# Patient Record
Sex: Female | Born: 1990 | Race: Black or African American | Hispanic: No | Marital: Single | Smoking: Former smoker
Health system: Southern US, Community
[De-identification: ages and names within clinical notes are randomized; demographics above are authoritative.]

## PROBLEM LIST (undated history)

## (undated) DIAGNOSIS — E079 Disorder of thyroid, unspecified: Secondary | ICD-10-CM

## (undated) HISTORY — PX: MOLE REMOVAL: SHX2046

---

## 2013-12-12 ENCOUNTER — Encounter (HOSPITAL_COMMUNITY): Payer: Self-pay | Admitting: Emergency Medicine

## 2013-12-12 ENCOUNTER — Emergency Department (HOSPITAL_COMMUNITY)
Admission: EM | Admit: 2013-12-12 | Discharge: 2013-12-12 | Disposition: A | Discharge status: Home / Self Care | Payer: Medicaid Other | Attending: Emergency Medicine | Admitting: Emergency Medicine

## 2013-12-12 DIAGNOSIS — Z87891 Personal history of nicotine dependence: Secondary | ICD-10-CM | POA: Diagnosis not present

## 2013-12-12 DIAGNOSIS — S01311A Laceration without foreign body of right ear, initial encounter: Secondary | ICD-10-CM | POA: Diagnosis present

## 2013-12-12 DIAGNOSIS — M791 Myalgia: Secondary | ICD-10-CM | POA: Diagnosis not present

## 2013-12-12 DIAGNOSIS — Z8639 Personal history of other endocrine, nutritional and metabolic disease: Secondary | ICD-10-CM | POA: Insufficient documentation

## 2013-12-12 DIAGNOSIS — S00431A Contusion of right ear, initial encounter: Secondary | ICD-10-CM

## 2013-12-12 HISTORY — DX: Disorder of thyroid, unspecified: E07.9

## 2013-12-12 MED ORDER — LIDOCAINE-EPINEPHRINE-TETRACAINE (LET) SOLUTION
3.0000 mL | Freq: Once | NASAL | Status: AC
  Administered 2013-12-12: 3 mL via TOPICAL
  Filled 2013-12-12: qty 3

## 2013-12-12 MED ORDER — NAPROXEN 500 MG PO TABS: 500.0000 mg | ORAL_TABLET | Freq: Two times a day (BID) | ORAL | Status: DC

## 2013-12-12 MED ORDER — IBUPROFEN 800 MG PO TABS
800.0000 mg | ORAL_TABLET | Freq: Once | ORAL | Status: AC
  Administered 2013-12-12: 800 mg via ORAL
  Filled 2013-12-12: qty 1

## 2013-12-12 NOTE — Discharge Instructions (Signed)
Please call your doctor for a followup appointment within 24-48 hours. When you talk to your doctor please let them know that you were seen in the emergency department and have them acquire all of your records so that they can discuss the findings with you and formulate a treatment plan to fully care for your new and ongoing problems. ° °[facility] Doctor List ° ° ° °Edward Hawkins MD. Specialty: Pulmonary Disease Contact information: [address]  °[address]  °[place] [address]  °[phone]  ° °Margaret Simpson, MD. Specialty: Family Medicine Contact information: [address]  °[place] [address]  °[phone]  ° °Scott Luking, MD. Specialty: Family Medicine Contact information: [address]  °[address]  °[place] [address]  °[phone]  ° °Tesfaye Fanta, MD Specialty: Internal Medicine Contact information: [address]  °[place] [address]  °[phone]  ° °Zach Hall, MD. Specialty: Internal Medicine Contact information: [address]  °[place] [address]  °[phone]  ° °Angus Mcinnis, MD. Specialty: Family Medicine Contact information: [address]  °[place] [address]  °[phone]  ° °Stephen Knowlton, MD. Specialty: Family Medicine Contact information: [address]  °[address]  °[place] [address]  °[phone]  ° °Roy Fagan, MD. Specialty: Internal Medicine Contact information: [address]  °[address]  ° [place] [address]  °[phone]  ° ° °
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
## 2016-02-13 ENCOUNTER — Ambulatory Visit (INDEPENDENT_AMBULATORY_CARE_PROVIDER_SITE_OTHER): Payer: Medicaid Other | Admitting: Certified Nurse Midwife

## 2016-02-13 VITALS — BP 104/63 | HR 86 | Wt 101.1 lb

## 2016-02-13 DIAGNOSIS — Z113 Encounter for screening for infections with a predominantly sexual mode of transmission: Secondary | ICD-10-CM

## 2016-02-13 DIAGNOSIS — Z3481 Encounter for supervision of other normal pregnancy, first trimester: Secondary | ICD-10-CM

## 2016-02-13 DIAGNOSIS — Z3687 Encounter for antenatal screening for uncertain dates: Secondary | ICD-10-CM

## 2016-02-13 DIAGNOSIS — O26851 Spotting complicating pregnancy, first trimester: Secondary | ICD-10-CM

## 2016-02-13 NOTE — Progress Notes (Signed)
Waynette ButteryErica Angert presents for NOB nurse interview visit. Pregnancy confirmation done at [facility]. lmp 12/22/15 (uncertain) EDD 09/27/16. 7 4/7.   G3- .P2002- . Pregnancy education material explained and given. __0_ cats in the home. NOB labs ordered. Drug screen and HIV ordered. Labs to be drawn at time of u/s. (Lab Amy off today) PNV encouraged. Genetic screening  to discuss with provider.  Mild nausea note. No meds needed at this time. Spotting x 1 day about a week ago.  None since. Will order dating scan.  Pt. To follow up with provider in _3_ weeks for NOB physical.  All questions answered.

## 2016-02-14 ENCOUNTER — Ambulatory Visit (INDEPENDENT_AMBULATORY_CARE_PROVIDER_SITE_OTHER): Payer: Medicaid Other

## 2016-02-14 ENCOUNTER — Other Ambulatory Visit: Payer: Self-pay | Admitting: Certified Nurse Midwife

## 2016-02-14 ENCOUNTER — Other Ambulatory Visit: Payer: Medicaid Other

## 2016-02-14 DIAGNOSIS — Z3687 Encounter for antenatal screening for uncertain dates: Secondary | ICD-10-CM

## 2016-02-14 DIAGNOSIS — O26851 Spotting complicating pregnancy, first trimester: Secondary | ICD-10-CM

## 2016-02-15 ENCOUNTER — Other Ambulatory Visit: Payer: Self-pay | Admitting: Certified Nurse Midwife

## 2016-02-15 LAB — CBC WITH DIFFERENTIAL
BASOS: 0 %
Basophils Absolute: 0 10*3/uL (ref 0.0–0.2)
EOS (ABSOLUTE): 0 10*3/uL (ref 0.0–0.4)
Eos: 0 %
Hematocrit: 36.4 % (ref 34.0–46.6)
Hemoglobin: 12.8 g/dL (ref 11.1–15.9)
Immature Grans (Abs): 0 10*3/uL (ref 0.0–0.1)
Immature Granulocytes: 0 %
LYMPHS ABS: 2.4 10*3/uL (ref 0.7–3.1)
Lymphs: 25 %
MCH: 31.1 pg (ref 26.6–33.0)
MCHC: 35.2 g/dL (ref 31.5–35.7)
MCV: 89 fL (ref 79–97)
MONOS ABS: 0.5 10*3/uL (ref 0.1–0.9)
Monocytes: 5 %
NEUTROS ABS: 6.5 10*3/uL (ref 1.4–7.0)
NEUTROS PCT: 70 %
RBC: 4.11 x10E6/uL (ref 3.77–5.28)
RDW: 14.1 % (ref 12.3–15.4)
WBC: 9.4 10*3/uL (ref 3.4–10.8)

## 2016-02-15 LAB — ABO AND RH: RH TYPE: POSITIVE

## 2016-02-15 LAB — VARICELLA ZOSTER ANTIBODY, IGG

## 2016-02-15 LAB — NICOTINE SCREEN, URINE

## 2016-02-15 LAB — SICKLE CELL SCREEN: SICKLE CELL SCREEN: POSITIVE — AB

## 2016-02-15 LAB — RPR: RPR: NONREACTIVE

## 2016-02-15 LAB — HEPATITIS B SURFACE ANTIGEN: Hepatitis B Surface Ag: NEGATIVE

## 2016-02-15 LAB — ANTIBODY SCREEN: Antibody Screen: NEGATIVE

## 2016-02-15 LAB — HIV ANTIBODY (ROUTINE TESTING W REFLEX): HIV Screen 4th Generation wRfx: NONREACTIVE

## 2016-02-15 LAB — RUBELLA SCREEN: RUBELLA: 23.7 {index} (ref >0.99)

## 2016-02-16 LAB — URINE CULTURE: Organism ID, Bacteria: NO GROWTH

## 2016-02-16 LAB — GC/CHLAMYDIA PROBE AMP
Chlamydia trachomatis, NAA: POSITIVE — AB
Neisseria gonorrhoeae by PCR: POSITIVE — AB

## 2016-02-19 ENCOUNTER — Telehealth: Payer: Self-pay | Admitting: Certified Nurse Midwife

## 2016-02-19 DIAGNOSIS — A749 Chlamydial infection, unspecified: Secondary | ICD-10-CM | POA: Insufficient documentation

## 2016-02-19 DIAGNOSIS — O98819 Other maternal infectious and parasitic diseases complicating pregnancy, unspecified trimester: Secondary | ICD-10-CM

## 2016-02-19 DIAGNOSIS — O98811 Other maternal infectious and parasitic diseases complicating pregnancy, first trimester: Principal | ICD-10-CM

## 2016-02-19 DIAGNOSIS — O98211 Gonorrhea complicating pregnancy, first trimester: Secondary | ICD-10-CM | POA: Insufficient documentation

## 2016-02-19 MED ORDER — AZITHROMYCIN 250 MG PO TABS: 1000.0000 mg | ORAL_TABLET | Freq: Once | ORAL | 1 refills | Status: AC

## 2016-02-19 NOTE — Telephone Encounter (Signed)
Telephone call to patient. Name and date of birth verified. Pt speaking and okay to proceed with phone call.   Informed of positive chlamydia and gonorrhea results from New OB labs. Discussed importance of taking medication, informing partners, and completing test of cure. Pt verbalized understanding.   Preferred pharmacy verfied and Rx for Azithromycin called in.   Pt will come to office for Rocephin injection.   Call back with further needs, questions, or concerns.    Gunnar BullaJenkins Michelle Lawhorn, CNM

## 2016-02-22 ENCOUNTER — Telehealth: Payer: Self-pay

## 2016-02-22 NOTE — Telephone Encounter (Signed)
Left message for pt to return call to followup on last visit.

## 2016-02-22 NOTE — Telephone Encounter (Signed)
-----   Message from Gunnar BullaJenkins Michelle Lawhorn, CNM sent at 02/19/2016  6:44 PM EST ----- Called last night and informed patient of positive STI results. She will be stopping by the office tomorrow for Ceftriaxone injection. Please remember to complete [place] Si[place] Sheet for the health department.   Thanks, JML

## 2016-02-25 NOTE — Telephone Encounter (Signed)
Attempted to contact patient- home number just beeped not able to leave a message. Mobile number was answered by a female who told this writer I had the wrong number. Per Sydney Stout at [facility] patient did pick up script for azithromycin. Letter sent to address listed for patient to please contact the office for a rhocephen inj.

## 2016-02-26 LAB — URINALYSIS, ROUTINE W REFLEX MICROSCOPIC
Bilirubin, UA: NEGATIVE
GLUCOSE, UA: NEGATIVE
Ketones, UA: NEGATIVE
LEUKOCYTES UA: NEGATIVE
Nitrite, UA: NEGATIVE
PH UA: 5.5 (ref 5.0–7.5)
PROTEIN UA: NEGATIVE
RBC, UA: NEGATIVE
Specific Gravity, UA: 1.014 (ref 1.005–1.030)
UUROB: 0.2 mg/dL (ref 0.2–1.0)

## 2016-02-26 LAB — MONITOR DRUG PROFILE 14(MW)

## 2016-03-06 ENCOUNTER — Encounter: Payer: Medicaid Other | Admitting: Certified Nurse Midwife

## 2016-03-10 ENCOUNTER — Encounter: Payer: Medicaid Other | Admitting: Certified Nurse Midwife

## 2016-03-11 ENCOUNTER — Encounter: Payer: Medicaid Other | Admitting: Certified Nurse Midwife

## 2016-03-13 ENCOUNTER — Encounter: Payer: Medicaid Other | Admitting: Certified Nurse Midwife

## 2016-03-21 ENCOUNTER — Ambulatory Visit (INDEPENDENT_AMBULATORY_CARE_PROVIDER_SITE_OTHER): Payer: Medicaid Other | Admitting: Certified Nurse Midwife

## 2016-03-21 ENCOUNTER — Encounter: Payer: Self-pay | Admitting: Certified Nurse Midwife

## 2016-03-21 VITALS — BP 107/68 | HR 85 | Wt 107.0 lb

## 2016-03-21 DIAGNOSIS — Z23 Encounter for immunization: Secondary | ICD-10-CM | POA: Diagnosis not present

## 2016-03-21 DIAGNOSIS — Z3482 Encounter for supervision of other normal pregnancy, second trimester: Secondary | ICD-10-CM | POA: Diagnosis not present

## 2016-03-21 LAB — POCT URINALYSIS DIPSTICK
BILIRUBIN UA: NEGATIVE
Blood, UA: NEGATIVE
GLUCOSE UA: NEGATIVE
Ketones, UA: NEGATIVE
Nitrite, UA: NEGATIVE
PROTEIN UA: NEGATIVE
SPEC GRAV UA: 1.015
Urobilinogen, UA: NEGATIVE
pH, UA: 6

## 2016-03-21 MED ORDER — CEFTRIAXONE SODIUM 250 MG IJ SOLR
250.0000 mg | Freq: Once | INTRAMUSCULAR | Status: AC
  Administered 2016-03-21: 250 mg via INTRAMUSCULAR

## 2016-03-21 NOTE — Progress Notes (Signed)
NOB PE- Pos for GC/CH- Completed Zithromax. Rochephin given today.

## 2016-03-21 NOTE — Progress Notes (Signed)
NEW OB HISTORY AND PHYSICAL  SUBJECTIVE:       Sydney Stout is a 26 y.o. G52P2002 female, Patient's last menstrual period was 12/22/2015 (approximate)., Estimated Date of Delivery: 09/11/16, [redacted]w[redacted]d, presents today for establishment of Prenatal Care.  She has no unusual complaints. She took her azithromycin, but forgot to come by the office to get her rocephin.   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 Her new partner who is the FOB has not been tested.    Gynecologic History  Patient's last menstrual period was 12/22/2015 (approximate).   Last Pap: 2015. Results were: normal  Obstetric History OB History  Gravida Para Term Preterm AB Living  3 2 2     2   SAB TAB Ectopic Multiple Live Births          2    # Outcome Date GA Lbr Len/2nd Weight Sex Delivery Anes PTL Lv  3 Current           2 Term 01/13/09   5 lb 11 oz (2.58 kg) F Vag-Spont  N LIV  1 Term 02/13/08   5 lb 11 oz (2.58 kg) M Vag-Spont  N LIV      Past Medical History:  Diagnosis Date  . Thyroid disease    per patient. Unsure of type or problem    Past Surgical History:  Procedure Laterality Date  . MOLE REMOVAL     behind right ear    Current Outpatient Prescriptions on File Prior to Visit  Medication Sig Dispense Refill  . Prenatal Vit-Fe Fumarate-FA (PRENATAL MULTIVITAMIN) TABS tablet Take 1 tablet by mouth daily at 12 noon.     No current facility-administered medications on file prior to visit.     No Known Allergies  Social History   Social History  . Marital status: Single    Spouse name: N/A  . Number of children: N/A  . Years of education: N/A   Occupational History  . Not on file.   Social History Main Topics  . Smoking status: Former Smoker    Quit date: 2015  . Smokeless tobacco: Former Neurosurgeon  . Alcohol use No  . Drug use: No  . Sexual activity: Yes    Birth control/ protection: None   Other Topics Concern  . Not on file   Social History Narrative  . No  narrative on file    Family History  Problem Relation Age of Onset  . Cancer Neg Hx   . Diabetes Neg Hx   . Hypertension Neg Hx     The following portions of the patient's history were reviewed and updated as appropriate: allergies, current medications, past OB history, past medical history, past surgical history, past family history, past social history, and problem list.    OBJECTIVE: Initial Physical Exam (New OB)  GENERAL APPEARANCE: alert, well appearing, in no apparent distress  HEAD: normocephalic, atraumatic  MOUTH: mucous membranes moist, pharynx normal without lesions  THYROID: no thyromegaly or masses present  BREASTS: no masses noted, no significant tenderness, no palpable axillary nodes, no skin changes  LUNGS: clear to auscultation, no wheezes, rales or rhonchi, symmetric air entry  HEART: regular rate and rhythm, no murmurs  ABDOMEN: soft, nontender, nondistended, no abnormal masses, no epigastric pain, fundus soft, nontender 15 weeks size and FHT present  EXTREMITIES: no redness or tenderness in the calves or thighs  SKIN: normal coloration and turgor, no rashes  LYMPH NODES: no adenopathy palpable  NEUROLOGIC:  alert, oriented, normal speech, no focal findings or movement disorder noted  PELVIC EXAM EXTERNAL GENITALIA: normal appearing vulva with no masses, tenderness or lesions VAGINA: no abnormal discharge or lesions CERVIX: no lesions or cervical motion tenderness UTERUS: gravid and consistent with 15 weeks ADNEXA: no masses palpable and nontender  ASSESSMENT: Normal pregnancy Positive sickle cell screen Chlamydia and Gonorrhea affecting pregnancy  PLAN: Prenatal care Rocephin IM today RTC x 4 weeks for ROB and anatomy scan See orders   Gunnar BullaJenkins Michelle Alexzandrea Normington, CNM

## 2016-03-21 NOTE — Patient Instructions (Signed)
Second Trimester of Pregnancy The second trimester is from week 13 through week 28, month 4 through 6. This is often the time in pregnancy that you feel your best. Often times, morning sickness has lessened or quit. You may have more energy, and you may get hungry more often. Your unborn baby (fetus) is growing rapidly. At the end of the sixth month, he or she is about 9 inches long and weighs about 1 pounds. You will likely feel the baby move (quickening) between 18 and 20 weeks of pregnancy. Follow these instructions at home:  Avoid all smoking, herbs, and alcohol. Avoid drugs not approved by your doctor.  Do not use any tobacco products, including cigarettes, chewing tobacco, and electronic cigarettes. If you need help quitting, ask your doctor. You may get counseling or other support to help you quit.  Only take medicine as told by your doctor. Some medicines are safe and some are not during pregnancy.  Exercise only as told by your doctor. Stop exercising if you start having cramps.  Eat regular, healthy meals.  Wear a good support bra if your breasts are tender.  Do not use hot tubs, steam rooms, or saunas.  Wear your seat belt when driving.  Avoid raw meat, uncooked cheese, and liter boxes and soil used by cats.  Take your prenatal vitamins.  Take 1500-2000 milligrams of calcium daily starting at the 20th week of pregnancy until you deliver your baby.  Try taking medicine that helps you poop (stool softener) as needed, and if your doctor approves. Eat more fiber by eating fresh fruit, vegetables, and whole grains. Drink enough fluids to keep your pee (urine) clear or pale yellow.  Take warm water baths (sitz baths) to soothe pain or discomfort caused by hemorrhoids. Use hemorrhoid cream if your doctor approves.  If you have puffy, bulging veins (varicose veins), wear support hose. Raise (elevate) your feet for 15 minutes, 3-4 times a day. Limit salt in your diet.  Avoid heavy  lifting, wear low heals, and sit up straight.  Rest with your legs raised if you have leg cramps or low back pain.  Visit your dentist if you have not gone during your pregnancy. Use a soft toothbrush to brush your teeth. Be gentle when you floss.  You can have sex (intercourse) unless your doctor tells you not to.  Go to your doctor visits. Get help if:  You feel dizzy.  You have mild cramps or pressure in your lower belly (abdomen).  You have a nagging pain in your belly area.  You continue to feel sick to your stomach (nauseous), throw up (vomit), or have watery poop (diarrhea).  You have bad smelling fluid coming from your vagina.  You have pain with peeing (urination). Get help right away if:  You have a fever.  You are leaking fluid from your vagina.  You have spotting or bleeding from your vagina.  You have severe belly cramping or pain.  You lose or gain weight rapidly.  You have trouble catching your breath and have chest pain.  You notice sudden or extreme puffiness (swelling) of your face, hands, ankles, feet, or legs.  You have not felt the baby move in over an hour.  You have severe headaches that do not go away with medicine.  You have vision changes. This information is not intended to replace advice given to you by your health care provider. Make sure you discuss any questions you have with your health care   provider. Document Released: 04/30/2009 Document Revised: 07/12/2015 Document Reviewed: 04/06/2012 Elsevier Interactive Patient Education  2017 Elsevier Inc.  

## 2016-03-29 ENCOUNTER — Encounter: Payer: Self-pay | Admitting: Certified Nurse Midwife

## 2016-04-18 ENCOUNTER — Ambulatory Visit (INDEPENDENT_AMBULATORY_CARE_PROVIDER_SITE_OTHER): Payer: Medicaid Other | Admitting: Certified Nurse Midwife

## 2016-04-18 ENCOUNTER — Ambulatory Visit: Payer: Medicaid Other

## 2016-04-18 ENCOUNTER — Encounter: Payer: Self-pay | Admitting: Certified Nurse Midwife

## 2016-04-18 VITALS — BP 118/62 | HR 107 | Wt 120.4 lb

## 2016-04-18 DIAGNOSIS — Z3492 Encounter for supervision of normal pregnancy, unspecified, second trimester: Secondary | ICD-10-CM

## 2016-04-18 LAB — POCT URINALYSIS DIPSTICK
BILIRUBIN UA: NEGATIVE
Blood, UA: NEGATIVE
Ketones, UA: NEGATIVE
LEUKOCYTES UA: NEGATIVE
Nitrite, UA: NEGATIVE
Protein, UA: NEGATIVE
Spec Grav, UA: 1.02
Urobilinogen, UA: NEGATIVE
pH, UA: 5

## 2016-04-18 NOTE — Progress Notes (Signed)
ROB-Missed anatomy scan scheduled for today due to transportation issues. Pt unsure about continuing pregnancy, FOB questions paternity. Information for [facility] given. Pt will reschedule anatomy scan and ROB x 4 weeks if needed. GC/Ch TOC sent.

## 2016-04-21 ENCOUNTER — Ambulatory Visit (INDEPENDENT_AMBULATORY_CARE_PROVIDER_SITE_OTHER): Payer: Medicaid Other

## 2016-04-21 DIAGNOSIS — Z3482 Encounter for supervision of other normal pregnancy, second trimester: Secondary | ICD-10-CM | POA: Diagnosis not present

## 2016-04-21 NOTE — Addendum Note (Signed)
Addended by: Brooke DareSICK, Tanish Prien L on: 04/21/2016 01:06 PM   Modules accepted: Orders

## 2016-04-22 LAB — GC/CHLAMYDIA PROBE AMP
Chlamydia trachomatis, NAA: NEGATIVE
NEISSERIA GONORRHOEAE BY PCR: NEGATIVE

## 2016-05-15 ENCOUNTER — Ambulatory Visit (INDEPENDENT_AMBULATORY_CARE_PROVIDER_SITE_OTHER): Payer: Medicaid Other | Admitting: Certified Nurse Midwife

## 2016-05-15 ENCOUNTER — Encounter: Payer: Self-pay | Admitting: Certified Nurse Midwife

## 2016-05-15 VITALS — BP 115/63 | HR 89 | Wt 126.7 lb

## 2016-05-15 DIAGNOSIS — O4442 Low lying placenta NOS or without hemorrhage, second trimester: Secondary | ICD-10-CM

## 2016-05-15 DIAGNOSIS — Z130 Encounter for screening for diseases of the blood and blood-forming organs and certain disorders involving the immune mechanism: Secondary | ICD-10-CM

## 2016-05-15 DIAGNOSIS — O99012 Anemia complicating pregnancy, second trimester: Secondary | ICD-10-CM

## 2016-05-15 DIAGNOSIS — Z131 Encounter for screening for diabetes mellitus: Secondary | ICD-10-CM

## 2016-05-15 DIAGNOSIS — Z3482 Encounter for supervision of other normal pregnancy, second trimester: Secondary | ICD-10-CM

## 2016-05-15 LAB — POCT URINALYSIS DIPSTICK
Bilirubin, UA: NEGATIVE
Blood, UA: NEGATIVE
Glucose, UA: NEGATIVE
KETONES UA: NEGATIVE
Leukocytes, UA: NEGATIVE
Nitrite, UA: NEGATIVE
Protein, UA: NEGATIVE
Spec Grav, UA: 1.015 (ref 1.030–1.035)
Urobilinogen, UA: NEGATIVE (ref ?–2.0)
pH, UA: 6 (ref 5.0–8.0)

## 2016-05-15 MED ORDER — FUSION PLUS PO CAPS: 1.0000 | ORAL_CAPSULE | Freq: Every day | ORAL | 1 refills | Status: AC

## 2016-05-15 MED ORDER — CITRANATAL BLOOM DHA 90-1 & 300 MG PO MISC: 1.0000 | Freq: Every day | ORAL | 6 refills | Status: AC

## 2016-05-15 NOTE — Patient Instructions (Addendum)
Round Ligament Pain The round ligament is a cord of muscle and tissue that helps to support the uterus. It can become a source of pain during pregnancy if it becomes stretched or twisted as the baby grows. The pain usually begins in the second trimester of pregnancy, and it can come and go until the baby is delivered. It is not a serious problem, and it does not cause harm to the baby. Round ligament pain is usually a short, sharp, and pinching pain, but it can also be a dull, lingering, and aching pain. The pain is felt in the lower side of the abdomen or in the groin. It usually starts deep in the groin and moves up to the outside of the hip area. Pain can occur with:  A sudden change in position.  Rolling over in bed.  Coughing or sneezing.  Physical activity. Follow these instructions at home: Watch your condition for any changes. Take these steps to help with your pain:  When the pain starts, relax. Then try:  Sitting down.  Flexing your knees up to your abdomen.  Lying on your side with one pillow under your abdomen and another pillow between your legs.  Sitting in a warm bath for 15-20 minutes or until the pain goes away.  Take over-the-counter and prescription medicines only as told by your health care provider.  Move slowly when you sit and stand.  Avoid long walks if they cause pain.  Stop or lessen your physical activities if they cause pain. Contact a health care provider if:  Your pain does not go away with treatment.  You feel pain in your back that you did not have before.  Your medicine is not helping. Get help right away if:  You develop a fever or chills.  You develop uterine contractions.  You develop vaginal bleeding.  You develop nausea or vomiting.  You develop diarrhea.  You have pain when you urinate. This information is not intended to replace advice given to you by your health care provider. Make sure you discuss any questions you have with  your health care provider. Document Released: 11/13/2007 Document Revised: 07/12/2015 Document Reviewed: 04/12/2014 Elsevier Interactive Patient Education  2017 Elsevier Inc. Minor Illnesses and Medications in Pregnancy  Cold/Flu:  Sudafed for congestion- Robitussin (plain) for cough- Tylenol for discomfort.  Please follow the directions on the label.  Try not to take any more than needed.  OTC Saline nasal spray and air humidifier or cool-mist  Vaporizer to sooth nasal irritation and to loosen congestion.  It is also important to increase intake of non carbonated fluids, especially if you have a fever.  Constipation:  Colace-2 capsules at bedtime; Metamucil- follow directions on label; Senokot- 1 tablet at bedtime.  Any one of these medications can be used.  It is also very important to increase fluids and fruits along with regular exercise.  If problem persists please call the office.  Diarrhea:  Kaopectate as directed on the label.  Eat a bland diet and increase fluids.  Avoid highly seasoned foods.  Headache:  Tylenol 1 or 2 tablets every 3-4 hours as needed  Indigestion:  Maalox, Mylanta, Tums or Rolaids- as directed on label.  Also try to eat small meals and avoid fatty, greasy or spicy foods.  Nausea with or without Vomiting:  Nausea in pregnancy is caused by increased levels of hormones in the body which influence the digestive system and cause irritation when stomach acids accumulate.  Symptoms usually  subside after 1st trimester of pregnancy.  Try the following: 1. Keep saltines, graham crackers or dry toast by your bed to eat upon awakening. 2. Don't let your stomach get empty.  Try to eat 5-6 small meals per day instead of 3 large ones. 3. Avoid greasy fatty or highly seasoned foods.  4. Take OTC Unisom 1 tablet at bed time along with OTC Vitamin B6 25-50 mg 3 times per day.    If nausea continues with vomiting and you are unable to keep down food and fluids you may need a  prescription medication.  Please notify your provider.   Sore throat:  Chloraseptic spray, throat lozenges and or plain Tylenol.  Vaginal Yeast Infection:  OTC Monistat for 7 days as directed on label.  If symptoms do not resolve within a week notify provider.  If any of the above problems do not subside with recommended treatment please call the office for further assistance.   Do not take Aspirin, Advil, Motrin or Ibuprofen.  * * OTC= Over the counter  

## 2016-05-15 NOTE — Progress Notes (Signed)
ROB-Pt doing well, she and the FOB are "working through things" and have decided to continue with pregnancy. Reports nightly heartburn, discussed use of OTC and home treatment measures. WIC appointment 04/2016, fingerstick iron "was low". Rx Citranatal Bloom. Reviewed anatomy scan and findings. Anatomy wnl, low lying placenta. Reviewed red flag symptoms and when to call. RTC x 4-5 weeks for glucola, f/u US, and ROB.   ULTRASOUND REPORT  Location: [facility] Date of Service: 04/21/16  Indications: Anatomy Findings:  Singleton intrauterine pregnancy is visualized with FHR at 152 BPM. Biometrics give an (U/S) Gestational age of [redacted] weeks and 4 days, and an (U/S) EDD of 09/11/16; this correlates with the clinically established EDD of 09/11/16.  Fetal presentation is footling breech, spine variable EFW: 297 grams ( 0 lbs. 10 oz.) Placenta: Posterior, grade 0 and low lying at 1.3 cm from the cervical os. AFI: Subjectively adequate with an MVP of 5.2 cm.  Anatomic survey is complete and appears WNL. Gender - Female.   Right Ovary measures 3.0 x 1.8 x 2.0 cm, and appears WNL. Left Ovary measures 2.3 x 1.3 x 2.0 cm, and appears WNL. There is no evidence of a corpus luteal cyst. Survey of the adnexa demonstrates no adnexal masses. There is no free peritoneal fluid in the cul de sac.  Impression: 1. 19 week 4 day Viable Singleton Intrauterine pregnancy by U/S. 2. (U/S) EDD is consistent with Clinically established (LMP) EDD of 09/11/16. 3. Normal appearing anatomy scan. 4. Low lying posterior placenta, 1.3 cm to the cervical os.

## 2016-05-27 ENCOUNTER — Telehealth: Payer: Self-pay | Admitting: Certified Nurse Midwife

## 2016-05-27 NOTE — Telephone Encounter (Signed)
Patient called because her throat is sore and it has been since 12 yesterday and she can't go back to work. She wants to know what to do. Please advise.

## 2016-05-27 NOTE — Telephone Encounter (Signed)
Left message for pt to either call in am or send me a my chart message that I needed more information.

## 2016-05-28 NOTE — Telephone Encounter (Signed)
LMTCO.

## 2016-06-13 ENCOUNTER — Ambulatory Visit: Payer: Medicaid Other

## 2016-06-13 ENCOUNTER — Ambulatory Visit (INDEPENDENT_AMBULATORY_CARE_PROVIDER_SITE_OTHER): Payer: Medicaid Other

## 2016-06-13 ENCOUNTER — Ambulatory Visit (INDEPENDENT_AMBULATORY_CARE_PROVIDER_SITE_OTHER): Payer: Medicaid Other | Admitting: Certified Nurse Midwife

## 2016-06-13 ENCOUNTER — Other Ambulatory Visit: Payer: Self-pay | Admitting: Certified Nurse Midwife

## 2016-06-13 ENCOUNTER — Encounter: Payer: Self-pay | Admitting: Certified Nurse Midwife

## 2016-06-13 VITALS — BP 125/92 | HR 96 | Wt 129.2 lb

## 2016-06-13 DIAGNOSIS — O4442 Low lying placenta NOS or without hemorrhage, second trimester: Secondary | ICD-10-CM | POA: Diagnosis not present

## 2016-06-13 DIAGNOSIS — Z3492 Encounter for supervision of normal pregnancy, unspecified, second trimester: Secondary | ICD-10-CM

## 2016-06-13 DIAGNOSIS — Z23 Encounter for immunization: Secondary | ICD-10-CM | POA: Diagnosis not present

## 2016-06-13 LAB — POCT URINALYSIS DIPSTICK
Bilirubin, UA: NEGATIVE
GLUCOSE UA: NEGATIVE
Ketones, UA: NEGATIVE
Leukocytes, UA: NEGATIVE
Nitrite, UA: NEGATIVE
Protein, UA: NEGATIVE
RBC UA: NEGATIVE
Urobilinogen, UA: NEGATIVE E.U./dL — AB
pH, UA: 6 (ref 5.0–8.0)

## 2016-06-13 NOTE — Addendum Note (Signed)
Addended by: Marchelle Folks on: 06/13/2016 08:25 AM   Modules accepted: Orders

## 2016-06-13 NOTE — Addendum Note (Signed)
Addended by: Marchelle Folks on: 06/13/2016 08:41 AM   Modules accepted: Orders

## 2016-06-13 NOTE — Patient Instructions (Signed)

## 2016-06-13 NOTE — Progress Notes (Signed)
ROB, doing well. No complaints. Glucola today. Follow up in 2 wks.   Doreene Burke, CNM

## 2016-06-14 LAB — HEMOGLOBIN AND HEMATOCRIT, BLOOD
Hematocrit: 34 % (ref 34.0–46.6)
Hemoglobin: 11.5 g/dL (ref 11.1–15.9)

## 2016-06-14 LAB — GLUCOSE, 1 HOUR GESTATIONAL: GESTATIONAL DIABETES SCREEN: 89 mg/dL (ref 65–139)

## 2016-06-27 ENCOUNTER — Encounter: Payer: Medicaid Other | Admitting: Obstetrics and Gynecology

## 2016-07-01 ENCOUNTER — Encounter: Payer: Medicaid Other | Admitting: Obstetrics and Gynecology

## 2016-07-02 ENCOUNTER — Ambulatory Visit (INDEPENDENT_AMBULATORY_CARE_PROVIDER_SITE_OTHER): Payer: Medicaid Other | Admitting: Obstetrics and Gynecology

## 2016-07-02 VITALS — BP 112/74 | HR 99 | Wt 128.3 lb

## 2016-07-02 DIAGNOSIS — Z3493 Encounter for supervision of normal pregnancy, unspecified, third trimester: Secondary | ICD-10-CM

## 2016-07-02 LAB — POCT URINALYSIS DIPSTICK
Bilirubin, UA: NEGATIVE
Blood, UA: NEGATIVE
Glucose, UA: NEGATIVE
Ketones, UA: NEGATIVE
NITRITE UA: NEGATIVE
PH UA: 6 (ref 5.0–8.0)
Protein, UA: NEGATIVE
Spec Grav, UA: 1.01 (ref 1.010–1.025)
Urobilinogen, UA: 0.2 E.U./dL

## 2016-07-02 MED ORDER — FLUCONAZOLE 150 MG PO TABS
150.0000 mg | ORAL_TABLET | Freq: Once | ORAL | 3 refills | Status: AC
Start: 2016-07-02 — End: 2016-07-02

## 2016-07-02 NOTE — Progress Notes (Signed)
ROB- pt is c/o vaginal itching- inside vagina

## 2016-07-02 NOTE — Progress Notes (Signed)
ROB- reports onset itching externally, x 2 days, + yeast on exam. Discussed PP birthcontrol and desires Depo(used in past). Plans breast & Bottle feeding and circumcision.

## 2016-07-16 ENCOUNTER — Ambulatory Visit (INDEPENDENT_AMBULATORY_CARE_PROVIDER_SITE_OTHER): Payer: Medicaid Other | Admitting: Certified Nurse Midwife

## 2016-07-16 ENCOUNTER — Encounter: Payer: Self-pay | Admitting: Certified Nurse Midwife

## 2016-07-16 VITALS — BP 120/84 | HR 73 | Wt 127.9 lb

## 2016-07-16 DIAGNOSIS — Z3493 Encounter for supervision of normal pregnancy, unspecified, third trimester: Secondary | ICD-10-CM | POA: Diagnosis not present

## 2016-07-16 LAB — POCT URINALYSIS DIPSTICK
Bilirubin, UA: NEGATIVE
Blood, UA: NEGATIVE
GLUCOSE UA: NEGATIVE
Ketones, UA: NEGATIVE
NITRITE UA: NEGATIVE
Protein, UA: NEGATIVE
SPEC GRAV UA: 1.01 (ref 1.010–1.025)
UROBILINOGEN UA: 0.2 U/dL
pH, UA: 7 (ref 5.0–8.0)

## 2016-07-16 NOTE — Patient Instructions (Signed)

## 2016-07-16 NOTE — Progress Notes (Signed)
ROB, doing well. Complains of vaginal pressure. Reviewed common discomforts in pregnancy . She denies LOF, vag bleeding , and contractions. Discussed GBS at 36 wk visit. She will return in 2 wks.   Doreene BurkeAnnie Lapine, CNM

## 2016-07-30 ENCOUNTER — Ambulatory Visit (INDEPENDENT_AMBULATORY_CARE_PROVIDER_SITE_OTHER): Payer: Medicaid Other | Admitting: Obstetrics and Gynecology

## 2016-07-30 VITALS — BP 107/62 | HR 90 | Wt 133.7 lb

## 2016-07-30 DIAGNOSIS — Z3493 Encounter for supervision of normal pregnancy, unspecified, third trimester: Secondary | ICD-10-CM

## 2016-07-30 NOTE — Progress Notes (Signed)
ROB- pt is having some braxton hicks, otherwise she is doing well

## 2016-07-30 NOTE — Progress Notes (Signed)
ROB-doing well, discussed BHC.,discussed PICA

## 2016-08-01 ENCOUNTER — Telehealth: Payer: Self-pay | Admitting: Certified Nurse Midwife

## 2016-08-01 MED ORDER — TERCONAZOLE 0.4 % VA CREA: 1.0000 | TOPICAL_CREAM | Freq: Every day | VAGINAL | 0 refills | Status: AC

## 2016-08-01 NOTE — Telephone Encounter (Signed)
Attempted to contact pt on cell phone- when phone was answered it said Welcome to [employer] [facility]. The home phone # just beeped so a mychart message was sent. Script escribed to [facility] in [place] and confirmation received.

## 2016-08-01 NOTE — Telephone Encounter (Signed)
Patient called and needs Marcelino DusterMichelle to send her prescription for a yeast infection to "[facility]" in [place]. Patient would also like for Marcelino DusterMichelle to give her a call to verify that the prescription was sent to the correct Pharmacy as well. Please advise.

## 2016-08-01 NOTE — Telephone Encounter (Signed)
Hello Sydney Stout,   She may have a prescription for Terazol 7. You may call her and let her know that it was sent as well. Please make sure she has scheduled her next ROB.  Thanks,   Serafina RoyalsMichelle Dinora Hemm, CNM

## 2016-08-11 ENCOUNTER — Encounter: Payer: Self-pay | Admitting: Certified Nurse Midwife

## 2016-08-11 ENCOUNTER — Ambulatory Visit (INDEPENDENT_AMBULATORY_CARE_PROVIDER_SITE_OTHER): Payer: Medicaid Other | Admitting: Certified Nurse Midwife

## 2016-08-11 VITALS — BP 106/76 | HR 77 | Wt 134.4 lb

## 2016-08-11 DIAGNOSIS — Z3493 Encounter for supervision of normal pregnancy, unspecified, third trimester: Secondary | ICD-10-CM

## 2016-08-11 LAB — POCT URINALYSIS DIPSTICK
BILIRUBIN UA: NEGATIVE
Blood, UA: NEGATIVE
GLUCOSE UA: NEGATIVE
Ketones, UA: NEGATIVE
LEUKOCYTES UA: NEGATIVE
NITRITE UA: NEGATIVE
PH UA: 7 (ref 5.0–8.0)
Protein, UA: NEGATIVE
Spec Grav, UA: 1.01 (ref 1.010–1.025)
Urobilinogen, UA: 0.2 E.U./dL

## 2016-08-11 NOTE — Patient Instructions (Addendum)
Vaginal Delivery Vaginal delivery means that you will give birth by pushing your baby out of your birth canal (vagina). A team of health care providers will help you before, during, and after vaginal delivery. Birth experiences are unique for every woman and every pregnancy, and birth experiences vary depending on where you choose to give birth. What should I do to prepare for my baby's birth? Before your baby is born, it is important to talk with your health care provider about:  Your labor and delivery preferences. These may include: ? Medicines that you may be given. ? How you will manage your pain. This might include non-medical pain relief techniques or injectable pain relief such as epidural analgesia. ? How you and your baby will be monitored during labor and delivery. ? Who may be in the labor and delivery room with you. ? Your feelings about surgical delivery of your baby (cesarean delivery, or C-section) if this becomes necessary. ? Your feelings about receiving donated blood through an IV tube (blood transfusion) if this becomes necessary.  Whether you are able: ? To take pictures or videos of the birth. ? To eat during labor and delivery. ? To move around, walk, or change positions during labor and delivery.  What to expect after your baby is born, such as: ? Whether delayed umbilical cord clamping and cutting is offered. ? Who will care for your baby right after birth. ? Medicines or tests that may be recommended for your baby. ? Whether breastfeeding is supported in your hospital or birth center. ? How long you will be in the hospital or birth center.  How any medical conditions you have may affect your baby or your labor and delivery experience.  To prepare for your baby's birth, you should also:  Attend all of your health care visits before delivery (prenatal visits) as recommended by your health care provider. This is important.  Prepare your home for your baby's  arrival. Make sure that you have: ? Diapers. ? Baby clothing. ? Feeding equipment. ? Safe sleeping arrangements for you and your baby.  Install a car seat in your vehicle. Have your car seat checked by a certified car seat installer to make sure that it is installed safely.  Think about who will help you with your new baby at home for at least the first several weeks after delivery.  What can I expect when I arrive at the birth center or hospital? Once you are in labor and have been admitted into the hospital or birth center, your health care provider may:  Review your pregnancy history and any concerns you have.  Insert an IV tube into one of your veins. This is used to give you fluids and medicines.  Check your blood pressure, pulse, temperature, and heart rate (vital signs).  Check whether your bag of water (amniotic sac) has broken (ruptured).  Talk with you about your birth plan and discuss pain control options.  Monitoring Your health care provider may monitor your contractions (uterine monitoring) and your baby's heart rate (fetal monitoring). You may need to be monitored:  Often, but not continuously (intermittently).  All the time or for long periods at a time (continuously). Continuous monitoring may be needed if: ? You are taking certain medicines, such as medicine to relieve pain or make your contractions stronger. ? You have pregnancy or labor complications.  Monitoring may be done by:  Placing a special stethoscope or a handheld monitoring device on your abdomen to   check your baby's heartbeat, and feeling your abdomen for contractions. This method of monitoring does not continuously record your baby's heartbeat or your contractions.  Placing monitors on your abdomen (external monitors) to record your baby's heartbeat and the frequency and length of contractions. You may not have to wear external monitors all the time.  Placing monitors inside of your uterus  (internal monitors) to record your baby's heartbeat and the frequency, length, and strength of your contractions. ? Your health care provider may use internal monitors if he or she needs more information about the strength of your contractions or your baby's heart rate. ? Internal monitors are put in place by passing a thin, flexible wire through your vagina and into your uterus. Depending on the type of monitor, it may remain in your uterus or on your baby's head until birth. ? Your health care provider will discuss the benefits and risks of internal monitoring with you and will ask for your permission before inserting the monitors.  Telemetry. This is a type of continuous monitoring that can be done with external or internal monitors. Instead of having to stay in bed, you are able to move around during telemetry. Ask your health care provider if telemetry is an option for you.  Physical exam Your health care provider may perform a physical exam. This may include:  Checking whether your baby is positioned: ? With the head toward your vagina (head-down). This is most common. ? With the head toward the top of your uterus (head-up or breech). If your baby is in a breech position, your health care provider may try to turn your baby to a head-down position so you can deliver vaginally. If it does not seem that your baby can be born vaginally, your provider may recommend surgery to deliver your baby. In rare cases, you may be able to deliver vaginally if your baby is head-up (breech delivery). ? Lying sideways (transverse). Babies that are lying sideways cannot be delivered vaginally.  Checking your cervix to determine: ? Whether it is thinning out (effacing). ? Whether it is opening up (dilating). ? How low your baby has moved into your birth canal.  What are the three stages of labor and delivery?  Normal labor and delivery is divided into the following three stages: Stage 1  Stage 1 is the  longest stage of labor, and it can last for hours or days. Stage 1 includes: ? Early labor. This is when contractions may be irregular, or regular and mild. Generally, early labor contractions are more than 10 minutes apart. ? Active labor. This is when contractions get longer, more regular, more frequent, and more intense. ? The transition phase. This is when contractions happen very close together, are very intense, and may last longer than during any other part of labor.  Contractions generally feel mild, infrequent, and irregular at first. They get stronger, more frequent (about every 2-3 minutes), and more regular as you progress from early labor through active labor and transition.  Many women progress through stage 1 naturally, but you may need help to continue making progress. If this happens, your health care provider may talk with you about: ? Rupturing your amniotic sac if it has not ruptured yet. ? Giving you medicine to help make your contractions stronger and more frequent.  Stage 1 ends when your cervix is completely dilated to 4 inches (10 cm) and completely effaced. This happens at the end of the transition phase. Stage 2  Once   your cervix is completely effaced and dilated to 4 inches (10 cm), you may start to feel an urge to push. It is common for the body to naturally take a rest before feeling the urge to push, especially if you received an epidural or certain other pain medicines. This rest period may last for up to 1-2 hours, depending on your unique labor experience.  During stage 2, contractions are generally less painful, because pushing helps relieve contraction pain. Instead of contraction pain, you may feel stretching and burning pain, especially when the widest part of your baby's head passes through the vaginal opening (crowning).  Your health care provider will closely monitor your pushing progress and your baby's progress through the vagina during stage 2.  Your  health care provider may massage the area of skin between your vaginal opening and anus (perineum) or apply warm compresses to your perineum. This helps it stretch as the baby's head starts to crown, which can help prevent perineal tearing. ? In some cases, an incision may be made in your perineum (episiotomy) to allow the baby to pass through the vaginal opening. An episiotomy helps to make the opening of the vagina larger to allow more room for the baby to fit through.  It is very important to breathe and focus so your health care provider can control the delivery of your baby's head. Your health care provider may have you decrease the intensity of your pushing, to help prevent perineal tearing.  After delivery of your baby's head, the shoulders and the rest of the body generally deliver very quickly and without difficulty.  Once your baby is delivered, the umbilical cord may be cut right away, or this may be delayed for 1-2 minutes, depending on your baby's health. This may vary among health care providers, hospitals, and birth centers.  If you and your baby are healthy enough, your baby may be placed on your chest or abdomen to help maintain the baby's temperature and to help you bond with each other. Some mothers and babies start breastfeeding at this time. Your health care team will dry your baby and help keep your baby warm during this time.  Your baby may need immediate care if he or she: ? Showed signs of distress during labor. ? Has a medical condition. ? Was born too early (prematurely). ? Had a bowel movement before birth (meconium). ? Shows signs of difficulty transitioning from being inside the uterus to being outside of the uterus. If you are planning to breastfeed, your health care team will help you begin a feeding. Stage 3  The third stage of labor starts immediately after the birth of your baby and ends after you deliver the placenta. The placenta is an organ that develops  during pregnancy to provide oxygen and nutrients to your baby in the womb.  Delivering the placenta may require some pushing, and you may have mild contractions. Breastfeeding can stimulate contractions to help you deliver the placenta.  After the placenta is delivered, your uterus should tighten (contract) and become firm. This helps to stop bleeding in your uterus. To help your uterus contract and to control bleeding, your health care provider may: ? Give you medicine by injection, through an IV tube, by mouth, or through your rectum (rectally). ? Massage your abdomen or perform a vaginal exam to remove any blood clots that are left in your uterus. ? Empty your bladder by placing a thin, flexible tube (catheter) into your bladder. ? Encourage   you to breastfeed your baby. After labor is over, you and your baby will be monitored closely to ensure that you are both healthy until you are ready to go home. Your health care team will teach you how to care for yourself and your baby. This information is not intended to replace advice given to you by your health care provider. Make sure you discuss any questions you have with your health care provider. Document Released: 11/13/2007 Document Revised: 08/24/2015 Document Reviewed: 02/18/2015 Elsevier Interactive Patient Education  2018 [employer] Inc.  

## 2016-08-11 NOTE — Progress Notes (Signed)
ROB-Pt doing well, reports irregular contractions that resolve with rest. 36 week cultures collected. Reviewed red flag symptoms and when to call. RTC x 1 week for ROB or sooner if needed.

## 2016-08-11 NOTE — Progress Notes (Signed)
Pt is here for a regular OB visit. States she is having some irregular contractions.

## 2016-08-13 LAB — STREP GP B NAA: Strep Gp B NAA: POSITIVE — AB

## 2016-08-13 LAB — GC/CHLAMYDIA PROBE AMP
CHLAMYDIA, DNA PROBE: NEGATIVE
NEISSERIA GONORRHOEAE BY PCR: NEGATIVE

## 2016-08-18 ENCOUNTER — Encounter: Payer: Medicaid Other | Admitting: Certified Nurse Midwife

## 2016-08-19 ENCOUNTER — Encounter: Payer: Medicaid Other | Admitting: Certified Nurse Midwife

## 2016-08-19 ENCOUNTER — Telehealth: Payer: Self-pay | Admitting: Certified Nurse Midwife

## 2016-08-19 NOTE — Telephone Encounter (Signed)
Patient a left a un-clear voicemail regarding her appointment, I tried to call the patient back to speak with her, Her mother answered the phone number we have on file and stated that she no longer has the number, The new number given to us was called and the voicemail is not setup and I was not able to speak with her. Thank you.

## 2016-08-25 ENCOUNTER — Encounter: Payer: Medicaid Other | Admitting: Certified Nurse Midwife

## 2016-08-27 ENCOUNTER — Encounter: Payer: Medicaid Other | Admitting: Obstetrics and Gynecology

## 2016-08-27 ENCOUNTER — Encounter: Payer: Medicaid Other | Admitting: Certified Nurse Midwife

## 2016-08-29 ENCOUNTER — Ambulatory Visit (INDEPENDENT_AMBULATORY_CARE_PROVIDER_SITE_OTHER): Payer: Medicaid Other | Admitting: Certified Nurse Midwife

## 2016-08-29 ENCOUNTER — Encounter: Payer: Self-pay | Admitting: Certified Nurse Midwife

## 2016-08-29 VITALS — BP 114/73 | HR 78 | Wt 138.5 lb

## 2016-08-29 DIAGNOSIS — Z3493 Encounter for supervision of normal pregnancy, unspecified, third trimester: Secondary | ICD-10-CM

## 2016-08-29 LAB — POCT URINALYSIS DIPSTICK
Bilirubin, UA: NEGATIVE
Blood, UA: NEGATIVE
Glucose, UA: NEGATIVE
KETONES UA: NEGATIVE
NITRITE UA: NEGATIVE
PH UA: 6 (ref 5.0–8.0)
PROTEIN UA: NEGATIVE
Spec Grav, UA: 1.01 (ref 1.010–1.025)
UROBILINOGEN UA: 0.2 U/dL

## 2016-08-29 NOTE — Patient Instructions (Signed)

## 2016-08-29 NOTE — Progress Notes (Signed)
ROB, doing well. No complaints. Reviewed labor precautions and pain management in labor. Discussed fetal movement. Follow up 1 wk.  Doreene BurkeAnnie Schaber, CNM

## 2016-08-29 NOTE — Progress Notes (Signed)
Pt is here for an OB visit. Pos GBS.

## 2016-09-05 ENCOUNTER — Encounter: Payer: Medicaid Other | Admitting: Certified Nurse Midwife

## 2016-10-15 ENCOUNTER — Encounter: Payer: Self-pay | Admitting: Certified Nurse Midwife

## 2016-10-15 ENCOUNTER — Ambulatory Visit (INDEPENDENT_AMBULATORY_CARE_PROVIDER_SITE_OTHER): Payer: Medicaid Other | Admitting: Certified Nurse Midwife

## 2016-10-15 DIAGNOSIS — Z3042 Encounter for surveillance of injectable contraceptive: Secondary | ICD-10-CM

## 2016-10-15 LAB — POCT URINE PREGNANCY: Preg Test, Ur: NEGATIVE

## 2016-10-15 MED ORDER — MEDROXYPROGESTERONE ACETATE 150 MG/ML IM SUSP: 150.0000 mg | INTRAMUSCULAR | 4 refills | Status: AC

## 2016-10-15 NOTE — Progress Notes (Signed)
Subjective:    Sydney Stout is a 26 y.o. [id number] African American female who presents for a postpartum visit. She is 6 weeks postpartum following a spontaneous vaginal delivery at [redacted] wks gestational weeks. Anest[place]: unknow , [place]. I have fully reviewed the prenatal and intrapartum course. Postpartum course has been uncomplicated. Baby's course has been uncomplicated. Baby is feeding by bottle. Bleeding no bleeding. Bowel function is normal. Bladder function is normal. Patient is sexually active. Last sexual activity: 1-2 wks ago. Contraception method is condoms. Postpartum depression screening: negative. Score 1.  Last pap 2015 and was negative.  The following portions of the patient's history were reviewed and updated as appropriate: allergies, current medications, past medical history, past surgical history and problem list.  Review of Systems Pertinent items are noted in HPI.   Vitals:   10/15/16 0959  BP: 112/68  Pulse: 74  Weight: 112 lb 14.4 oz (51.2 kg)   Patient's last menstrual period was 12/22/2015 (approximate).  Objective:   General:  alert, cooperative and no distress   Breasts:  deferred, no complaints  Lungs: clear to auscultation bilaterally  Heart:  regular rate and rhythm  Abdomen: soft, nontender   Vulva: normal  Vagina: normal vagina  Cervix:  closed  Corpus: Well-involuted  Adnexa:  Non-palpable  Rectal Exam: no hemorrhoids        Assessment:   Postpartum exam 6wks s/p NSVD Bottle feeding Depression screening Contraception counseling   Plan:  : Depo-Provera injections Follow up in: ASAP for depo injectionr earlier if needed  Sydney BurkeAnnie Yiu, CNM

## 2016-10-15 NOTE — Addendum Note (Signed)
Addended by: Jackquline DenmarkIDGEWAY, Osamah Schmader W on: 10/15/2016 05:40 PM   Modules accepted: Orders

## 2016-10-15 NOTE — Patient Instructions (Signed)
Preventive Care 18-39 Years, Female Preventive care refers to lifestyle choices and visits with your health care provider that can promote health and wellness. What does preventive care include?  A yearly physical exam. This is also called an annual well check.  Dental exams once or twice a year.  Routine eye exams. Ask your health care provider how often you should have your eyes checked.  Personal lifestyle choices, including: ? Daily care of your teeth and gums. ? Regular physical activity. ? Eating a healthy diet. ? Avoiding tobacco and drug use. ? Limiting alcohol use. ? Practicing safe sex. ? Taking vitamin and mineral supplements as recommended by your health care provider. What happens during an annual well check? The services and screenings done by your health care provider during your annual well check will depend on your age, overall health, lifestyle risk factors, and family history of disease. Counseling Your health care provider may ask you questions about your:  Alcohol use.  Tobacco use.  Drug use.  Emotional well-being.  Home and relationship well-being.  Sexual activity.  Eating habits.  Work and work Statistician.  Method of birth control.  Menstrual cycle.  Pregnancy history.  Screening You may have the following tests or measurements:  Height, weight, and BMI.  Diabetes screening. This is done by checking your blood sugar (glucose) after you have not eaten for a while (fasting).  Blood pressure.  Lipid and cholesterol levels. These may be checked every 5 years starting at age 66.  Skin check.  Hepatitis C blood test.  Hepatitis B blood test.  Sexually transmitted disease (STD) testing.  BRCA-related cancer screening. This may be done if you have a family history of breast, ovarian, tubal, or peritoneal cancers.  Pelvic exam and Pap test. This may be done every 3 years starting at age 40. Starting at age 59, this may be done every 5  years if you have a Pap test in combination with an HPV test.  Discuss your test results, treatment options, and if necessary, the need for more tests with your health care provider. Vaccines Your health care provider may recommend certain vaccines, such as:  Influenza vaccine. This is recommended every year.  Tetanus, diphtheria, and acellular pertussis (Tdap, Td) vaccine. You may need a Td booster every 10 years.  Varicella vaccine. You may need this if you have not been vaccinated.  HPV vaccine. If you are 69 or younger, you may need three doses over 6 months.  Measles, mumps, and rubella (MMR) vaccine. You may need at least one dose of MMR. You may also need a second dose.  Pneumococcal 13-valent conjugate (PCV13) vaccine. You may need this if you have certain conditions and were not previously vaccinated.  Pneumococcal polysaccharide (PPSV23) vaccine. You may need one or two doses if you smoke cigarettes or if you have certain conditions.  Meningococcal vaccine. One dose is recommended if you are age 27-21 years and a first-year college student living in a residence hall, or if you have one of several medical conditions. You may also need additional booster doses.  Hepatitis A vaccine. You may need this if you have certain conditions or if you travel or work in places where you may be exposed to hepatitis A.  Hepatitis B vaccine. You may need this if you have certain conditions or if you travel or work in places where you may be exposed to hepatitis B.  Haemophilus influenzae type b (Hib) vaccine. You may need this if  you have certain risk factors.  Talk to your health care provider about which screenings and vaccines you need and how often you need them. This information is not intended to replace advice given to you by your health care provider. Make sure you discuss any questions you have with your health care provider. Document Released: 04/01/2001 Document Revised: 10/24/2015  Document Reviewed: 12/05/2014 Elsevier Interactive Patient Education  2017 [employer].

## 2016-10-17 ENCOUNTER — Ambulatory Visit: Payer: Medicaid Other

## 2016-10-22 ENCOUNTER — Telehealth: Payer: Self-pay | Admitting: Certified Nurse Midwife

## 2016-10-22 NOTE — Telephone Encounter (Signed)
Pt notified via my chart of need for pap smear in 4-6 wks.   Doreene BurkeAnnie Michels, CNM

## 2017-08-13 IMAGING — US US OB FOLLOW-UP
1 series · 14 of 28 positions shown · non-contrast
Comparison: none

[Series 1: us ob follow-up · 0.25mm/px · 14 of 29 slices shown]
[im 2/29]
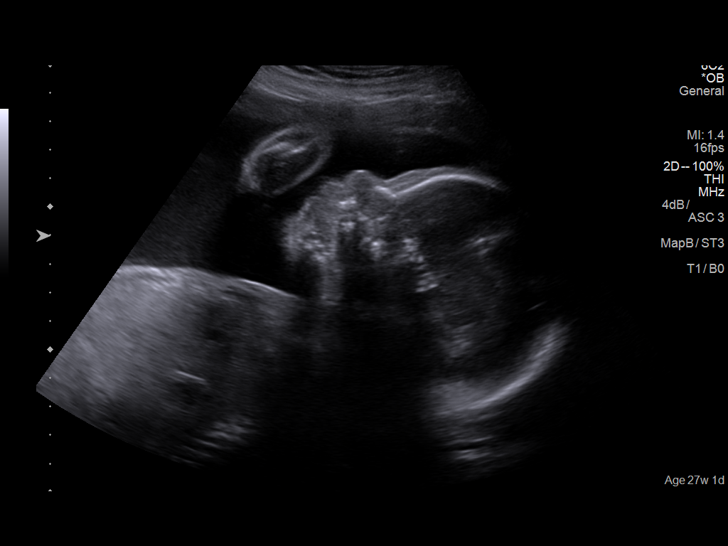
[im 4/29]
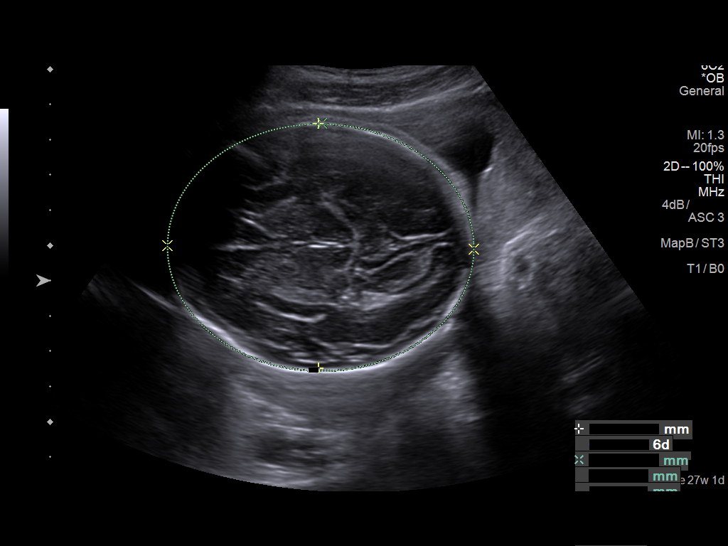
[im 6/29]
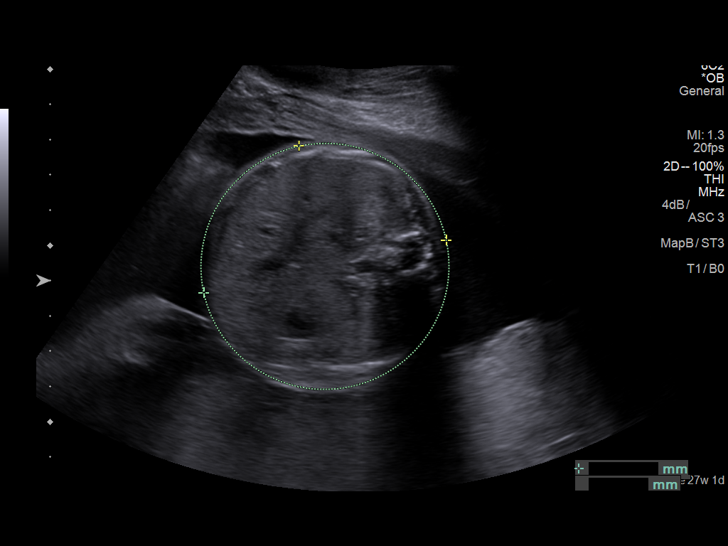
[im 8/29]
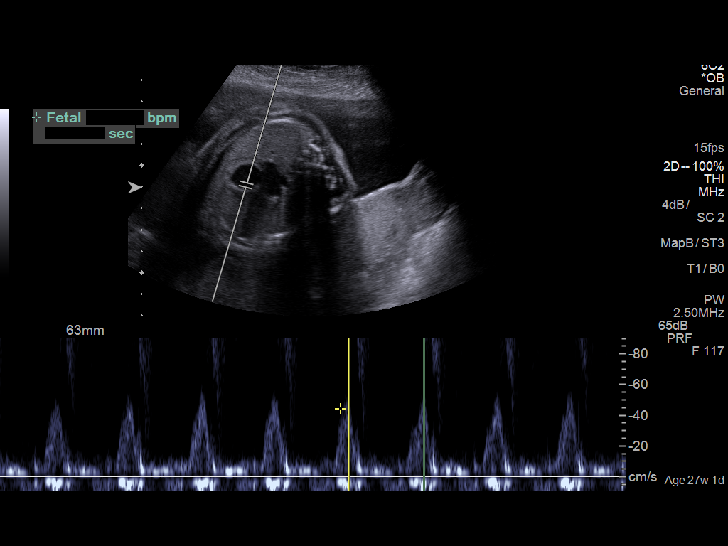
[im 10/29]
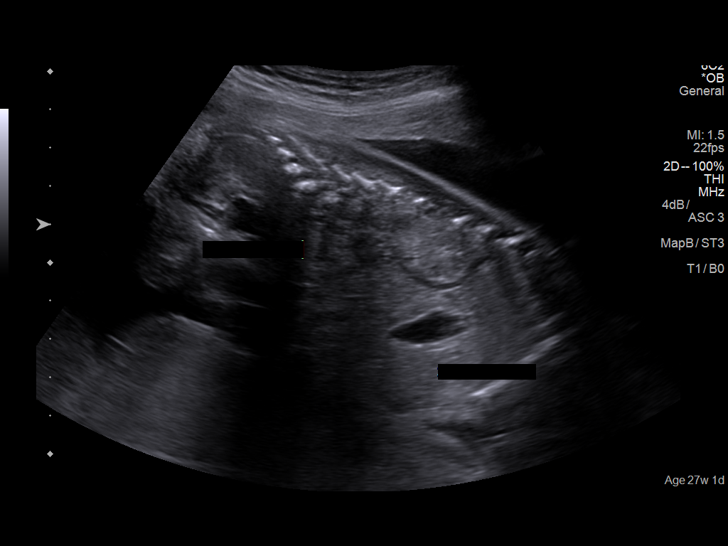
[im 12/29]
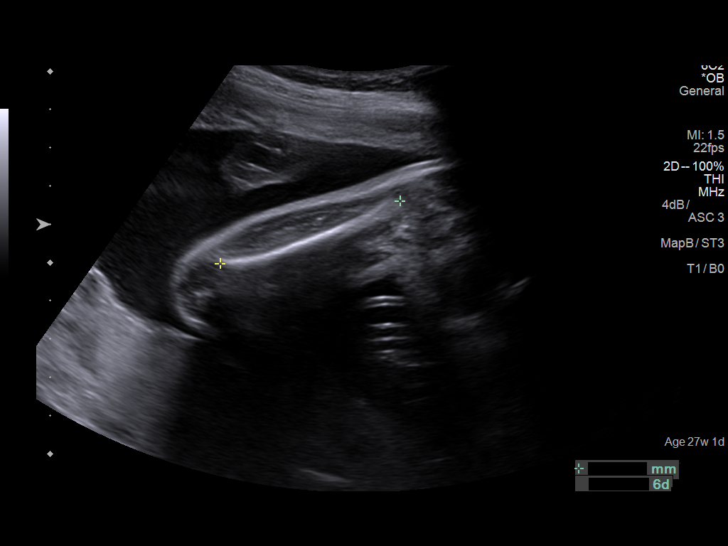
[im 14/29]
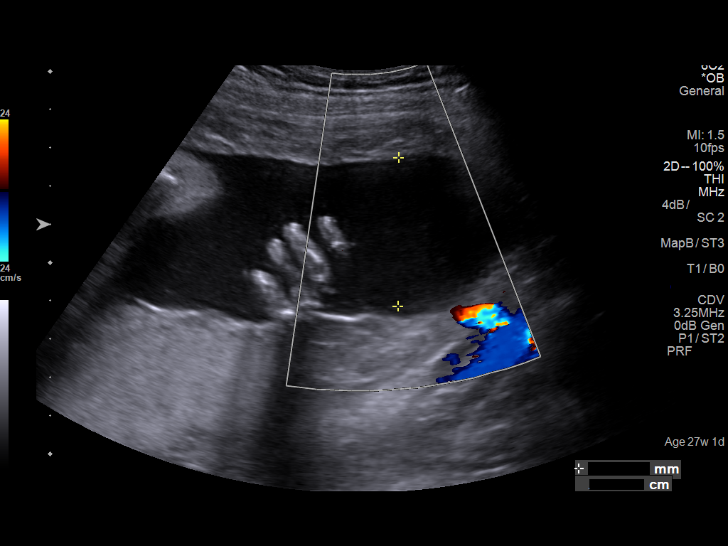
[im 16/29]
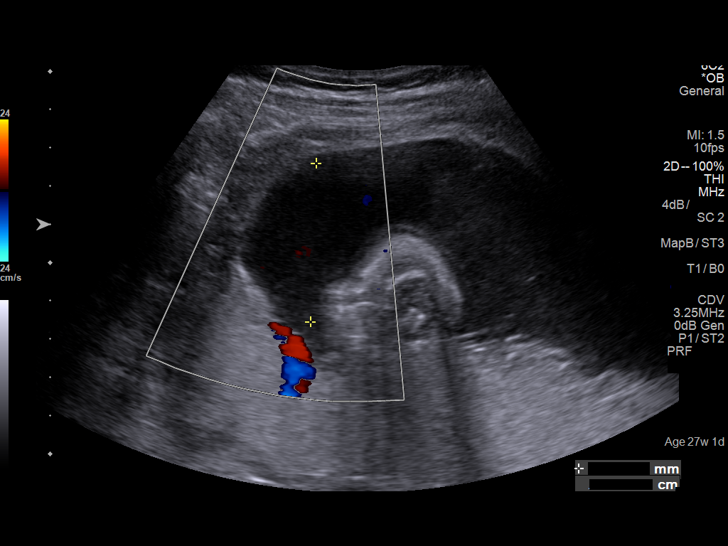
[im 18/29]
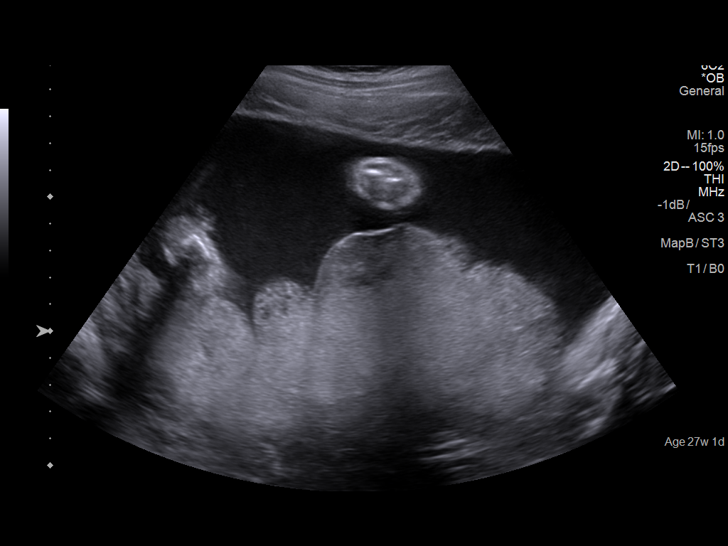
[im 20/29]
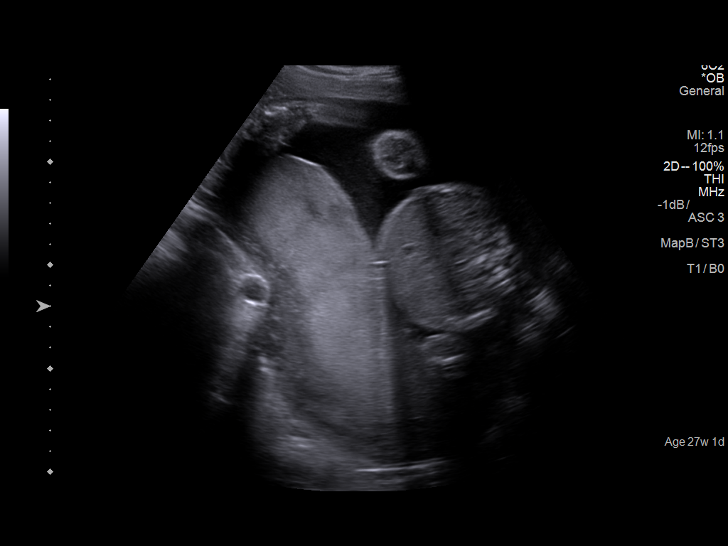
[im 22/29]
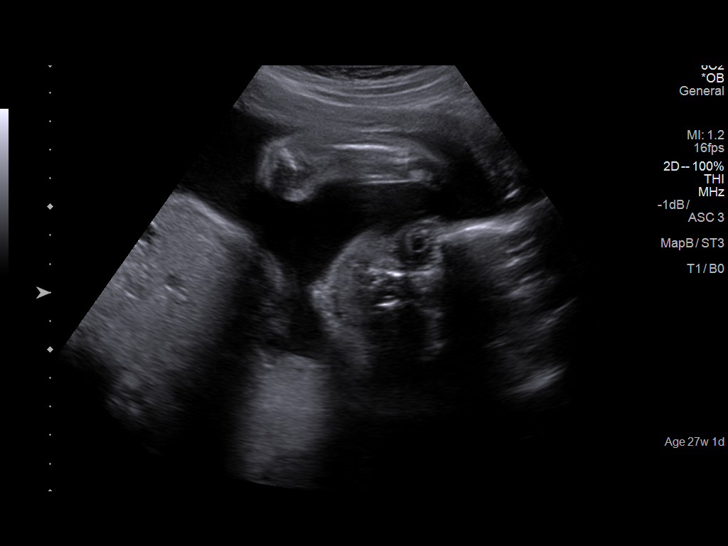
[im 24/29]
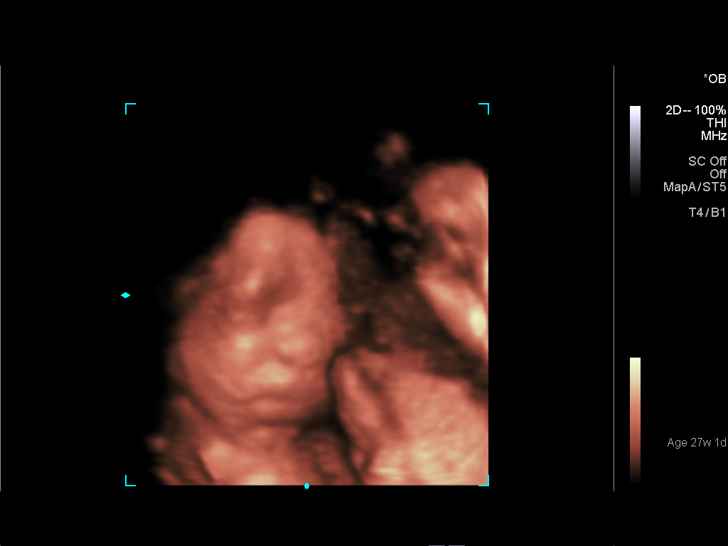
[im 26/29]
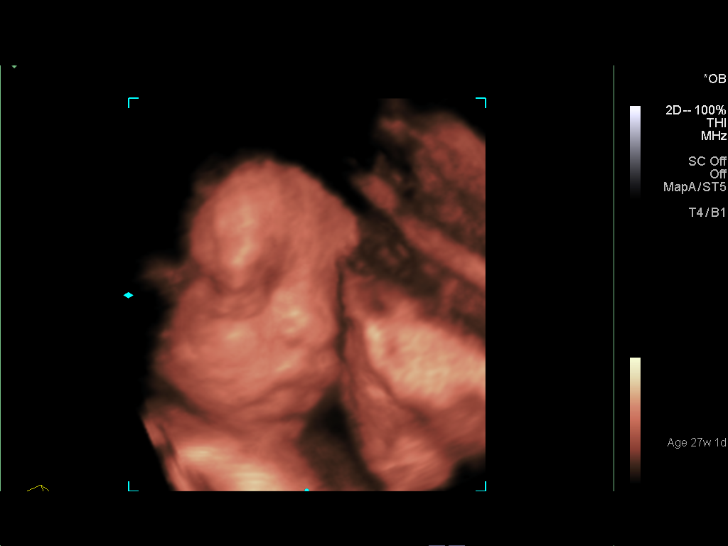
[im 29/29]
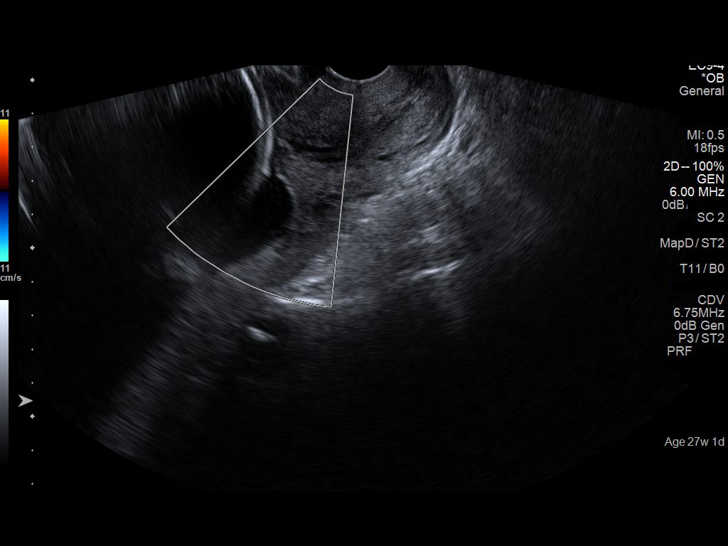

[14 of 28 positions shown; findings below may reference images not displayed]

Canned report from images found in remote index.

Refer to host system for actual result text.
